# Patient Record
Sex: Female | Born: 1975 | Race: White | Marital: Married | Smoking: Never smoker
Health system: Southern US, Community
[De-identification: ages and names within clinical notes are randomized; demographics above are authoritative.]

## PROBLEM LIST (undated history)

## (undated) DIAGNOSIS — E079 Disorder of thyroid, unspecified: Secondary | ICD-10-CM

## (undated) DIAGNOSIS — E119 Type 2 diabetes mellitus without complications: Secondary | ICD-10-CM

## (undated) HISTORY — DX: Type 2 diabetes mellitus without complications: E11.9

## (undated) HISTORY — DX: Disorder of thyroid, unspecified: E07.9

## (undated) HISTORY — PX: BRAIN SURGERY: SHX531

---

## 2018-09-09 ENCOUNTER — Ambulatory Visit: Payer: Self-pay | Admitting: Urology

## 2018-10-03 ENCOUNTER — Ambulatory Visit: Payer: Self-pay | Admitting: Urology

## 2019-08-20 ENCOUNTER — Encounter: Payer: Self-pay | Admitting: Emergency Medicine

## 2019-08-20 ENCOUNTER — Emergency Department: Payer: BC Managed Care – PPO

## 2019-08-20 ENCOUNTER — Emergency Department
Admission: EM | Admit: 2019-08-20 | Discharge: 2019-08-20 | Disposition: A | Discharge status: Home / Self Care | Payer: BC Managed Care – PPO | Attending: Emergency Medicine | Admitting: Emergency Medicine

## 2019-08-20 DIAGNOSIS — M545 Low back pain: Secondary | ICD-10-CM | POA: Insufficient documentation

## 2019-08-20 DIAGNOSIS — Z5321 Procedure and treatment not carried out due to patient leaving prior to being seen by health care provider: Secondary | ICD-10-CM | POA: Insufficient documentation

## 2019-08-20 DIAGNOSIS — R300 Dysuria: Secondary | ICD-10-CM | POA: Diagnosis not present

## 2019-08-20 DIAGNOSIS — R319 Hematuria, unspecified: Secondary | ICD-10-CM | POA: Diagnosis not present

## 2019-08-20 DIAGNOSIS — R1031 Right lower quadrant pain: Secondary | ICD-10-CM | POA: Insufficient documentation

## 2019-08-20 DIAGNOSIS — R1032 Left lower quadrant pain: Secondary | ICD-10-CM | POA: Diagnosis present

## 2019-08-20 LAB — URINALYSIS, COMPLETE (UACMP) WITH MICROSCOPIC
Bilirubin Urine: NEGATIVE
Glucose, UA: NEGATIVE mg/dL
Ketones, ur: NEGATIVE mg/dL
Leukocytes,Ua: NEGATIVE
Nitrite: NEGATIVE
Protein, ur: 30 mg/dL — AB
RBC / HPF: >50 RBC/hpf — ABNORMAL HIGH (ref 0–5)
Specific Gravity, Urine: 1.025 (ref 1.005–1.030)
pH: 5 (ref 5.0–8.0)

## 2019-08-20 LAB — COMPREHENSIVE METABOLIC PANEL
ALT: 21 U/L (ref 0–44)
AST: 19 U/L (ref 15–41)
Albumin: 4.4 g/dL (ref 3.5–5.0)
Alkaline Phosphatase: 44 U/L (ref 38–126)
Anion gap: 10 (ref 5–15)
BUN: 23 mg/dL — ABNORMAL HIGH (ref 6–20)
CO2: 22 mmol/L (ref 22–32)
Calcium: 9.1 mg/dL (ref 8.9–10.3)
Chloride: 106 mmol/L (ref 98–111)
Creatinine, Ser: 0.86 mg/dL (ref 0.44–1.00)
GFR calc Af Amer: >60 mL/min (ref >60)
GFR calc non Af Amer: >60 mL/min (ref >60)
Glucose, Bld: 92 mg/dL (ref 70–99)
Potassium: 4 mmol/L (ref 3.5–5.1)
Sodium: 138 mmol/L (ref 135–145)
Total Bilirubin: 0.7 mg/dL (ref 0.3–1.2)
Total Protein: 7.7 g/dL (ref 6.5–8.1)

## 2019-08-20 LAB — CBC
HCT: 42.6 % (ref 36.0–46.0)
Hemoglobin: 13.8 g/dL (ref 12.0–15.0)
MCH: 28 pg (ref 26.0–34.0)
MCHC: 32.4 g/dL (ref 30.0–36.0)
MCV: 86.4 fL (ref 80.0–100.0)
Platelets: 267 10*3/uL (ref 150–400)
RBC: 4.93 MIL/uL (ref 3.87–5.11)
RDW: 13.2 % (ref 11.5–15.5)
WBC: 7.6 10*3/uL (ref 4.0–10.5)
nRBC: 0 % (ref 0.0–0.2)

## 2019-08-20 LAB — LIPASE, BLOOD: Lipase: 24 U/L (ref 11–51)

## 2019-08-20 LAB — POCT PREGNANCY, URINE: Preg Test, Ur: NEGATIVE

## 2019-08-20 NOTE — ED Notes (Signed)
No answer when called several times from lobby 

## 2019-08-20 NOTE — ED Triage Notes (Signed)
Pt c/o right lower abdominal pain that radiates into lower right side of back. Pt also having hematuria and pain with urination. Pt has hx/o kidney stones.

## 2019-08-21 ENCOUNTER — Encounter: Payer: Self-pay | Admitting: Urology

## 2019-08-21 ENCOUNTER — Other Ambulatory Visit: Payer: Self-pay | Admitting: Family Medicine

## 2019-08-21 ENCOUNTER — Ambulatory Visit: Admit: 2019-08-21 | Payer: BC Managed Care – PPO | Admitting: Urology

## 2019-08-21 ENCOUNTER — Other Ambulatory Visit: Payer: Self-pay

## 2019-08-21 ENCOUNTER — Ambulatory Visit (INDEPENDENT_AMBULATORY_CARE_PROVIDER_SITE_OTHER): Payer: BC Managed Care – PPO | Admitting: Urology

## 2019-08-21 ENCOUNTER — Ambulatory Visit
Admission: RE | Admit: 2019-08-21 | Discharge: 2019-08-21 | Disposition: A | Discharge status: Home / Self Care | Payer: BC Managed Care – PPO | Source: Ambulatory Visit | Attending: Family Medicine | Admitting: Family Medicine

## 2019-08-21 VITALS — BP 130/80 | HR 74 | Ht 65.0 in | Wt 247.0 lb

## 2019-08-21 DIAGNOSIS — R319 Hematuria, unspecified: Secondary | ICD-10-CM

## 2019-08-21 DIAGNOSIS — N200 Calculus of kidney: Secondary | ICD-10-CM | POA: Insufficient documentation

## 2019-08-21 DIAGNOSIS — R109 Unspecified abdominal pain: Secondary | ICD-10-CM | POA: Insufficient documentation

## 2019-08-21 DIAGNOSIS — N23 Unspecified renal colic: Secondary | ICD-10-CM | POA: Diagnosis present

## 2019-08-21 DIAGNOSIS — N201 Calculus of ureter: Secondary | ICD-10-CM

## 2019-08-21 SURGERY — LITHOTRIPSY, ESWL
Anesthesia: Moderate Sedation | Laterality: Right

## 2019-08-21 MED ORDER — TAMSULOSIN HCL 0.4 MG PO CAPS: 0.4000 mg | ORAL_CAPSULE | Freq: Every day | ORAL | 0 refills | Status: AC

## 2019-08-21 NOTE — Progress Notes (Signed)
08/21/19 2:28 PM   Desyre Filkins 10/08/1976 [id number]  Referring provider: Martin Majestic, [place],  [place] [address]  CC: Right renal colic  HPI: I saw Ms. Tani in urology clinic in consultation from Gordy Clement, NP for right renal colic.  She is a 43 year old female with past medical history notable for pulmonary emboli in 2009 from unknown etiology, currently on Xarelto, type 2 diabetes, and history of trigeminal neuralgia.  She presented briefly to the emergency room last night with right-sided flank pain and lab work was notable for for urinalysis with greater than 50 RBCs, 6-10 WBCs, few bacteria, nitrite negative, no leukocytes.  She left the ER before undergoing any imaging.  She underwent a stat CT stone protocol with her PCP today that showed a 5 mm right proximal ureteral stone with some upstream hydronephrosis.  There is a left renal nonobstructing upper pole stone.  She reports she has had a few months of intermittent right-sided pain, however her first episode of true renal colic was last night.  She has a history of 3 spontaneously passed stones previously when she would deny well, and has never required surgery.  She has come off her Xarelto for procedures in the past.  She denies any fevers, chills, dysuria.  Her pain is currently well controlled at this time.  There are no aggravating or alleviating factors.   PMH: Diabetes History of pulmonary embolus Trigeminal neuralgia   Surgical History: No past surgical history on file.   Allergies: No Known Allergies  Family History: No family history on file.  Social History:  reports that she has never smoked. She has never used smokeless tobacco. No history on file for alcohol and drug.  ROS: Please see flowsheet from today's date for complete review of systems.  Physical Exam: Ht 5\' 5"  (1.651 m)   Wt 247 lb (112 kg)   BMI 41.10 kg/m    Constitutional:  Alert and oriented, No acute  distress. Cardiovascular: Regular rate and rhythm Respiratory: Clear to auscultation bilaterally GI: Abdomen is soft, nontender, nondistended, no abdominal masses GU: Right CVA tenderness Lymph: No cervical or inguinal lymphadenopathy. Skin: No rashes, bruises or suspicious lesions. Neurologic: Grossly intact, no focal deficits, moving all 4 extremities. Psychiatric: Normal mood and affect.  Laboratory Data: Reviewed Few bacteria, nitrite negative, 6-10 WBCs, greater than 50 RBCs  Pertinent Imaging: I have personally reviewed the CT stone protocol from today, 5 mm right proximal ureteral stone, clearly seen on scout CT, 600HU, 13cm SSD  Assessment & Plan:   In summary, the patient is a 43 year old co-morbid female with history of PE on Xarelto with a non-infected 5 mm right proximal ureteral stone.  Her pain is currently very well controlled.  We discussed various treatment options for urolithiasis including observation with or without medical expulsive therapy, shockwave lithotripsy (SWL), ureteroscopy and laser lithotripsy with stent placement, and percutaneous nephrolithotomy.  We discussed that management is based on stone size, location, density, patient co-morbidities, and patient preference.   Stones <42mm in size have a >80% spontaneous passage rate. Data surrounding the use of tamsulosin for medical expulsive therapy is controversial, but meta analyses suggests it is most efficacious for distal stones between 5-74mm in size. Possible side effects include dizziness/lightheadedness, and retrograde ejaculation.  SWL has a lower stone free rate in a single procedure, but also a lower complication rate compared to ureteroscopy and avoids a stent and associated stent related symptoms. Possible complications include renal  hematoma, steinstrasse, and need for additional treatment.  Ureteroscopy with laser lithotripsy and stent placement has a higher stone free rate than SWL in a single  procedure, however increased complication rate including possible infection, ureteral injury, bleeding, and stent related morbidity. Common stent related symptoms include dysuria, urgency/frequency, and flank pain.  -She would like a trial of medical expulsive therapy which is very reasonable with her stone size and anticoagulation -Close follow-up on 09/01/2019 with KUB prior, if stone still present would hold Xarelto and pursue shockwave lithotripsy that Thursday 11/5 -We discussed return precautions including uncontrolled pain or fever over 101   Sondra Come, MD  [facility] [address], [address] [place], [place] [address] [phone]

## 2019-08-21 NOTE — Patient Instructions (Signed)
Lithotripsy  Lithotripsy is a treatment that can sometimes help eliminate kidney stones and the pain that they cause. A form of lithotripsy, also known as extracorporeal shock wave lithotripsy, is a nonsurgical procedure that crushes a kidney stone with shock waves. These shock waves pass through your body and focus on the kidney stone. They cause the kidney stone to break up while it is still in the urinary tract. This makes it easier for the smaller pieces of stone to pass in the urine. Tell a health care provider about:  Any allergies you have.  All medicines you are taking, including vitamins, herbs, eye drops, creams, and over-the-counter medicines.  Any blood disorders you have.  Any surgeries you have had.  Any medical conditions you have.  Whether you are pregnant or may be pregnant.  Any problems you or family members have had with anesthetic medicines. What are the risks? Generally, this is a safe procedure. However, problems may occur, including:  Infection.  Bleeding of the kidney.  Bruising of the kidney or skin.  Scarring of the kidney, which can lead to: ? Increased blood pressure. ? Poor kidney function. ? Return (recurrence) of kidney stones.  Damage to other structures or organs, such as the liver, colon, spleen, or pancreas.  Blockage (obstruction) of the the tube that carries urine from the kidney to the bladder (ureter).  Failure of the kidney stone to break into pieces (fragments). What happens before the procedure? Staying hydrated Follow instructions from your health care provider about hydration, which may include:  Up to 2 hours before the procedure - you may continue to drink clear liquids, such as water, clear fruit juice, black coffee, and plain tea. Eating and drinking restrictions Follow instructions from your health care provider about eating and drinking, which may include:  8 hours before the procedure - stop eating heavy meals or foods  such as meat, fried foods, or fatty foods.  6 hours before the procedure - stop eating light meals or foods, such as toast or cereal.  6 hours before the procedure - stop drinking milk or drinks that contain milk.  2 hours before the procedure - stop drinking clear liquids. General instructions  Plan to have someone take you home from the hospital or clinic.  Ask your health care provider about: ? Changing or stopping your regular medicines. This is especially important if you are taking diabetes medicines or blood thinners. ? Taking medicines such as aspirin and ibuprofen. These medicines and other NSAIDs can thin your blood. Do not take these medicines for 7 days before your procedure if your health care provider instructs you not to.  You may have tests, such as: ? Blood tests. ? Urine tests. ? Imaging tests, such as a CT scan. What happens during the procedure?  To lower your risk of infection: ? Your health care team will wash or sanitize their hands. ? Your skin will be washed with soap.  An IV tube will be inserted into one of your veins. This tube will give you fluids and medicines.  You will be given one or more of the following: ? A medicine to help you relax (sedative). ? A medicine to make you fall asleep (general anesthetic).  A water-filled cushion may be placed behind your kidney or on your abdomen. In some cases you may be placed in a tub of lukewarm water.  Your body will be positioned in a way that makes it easy to target the kidney   stone.  A flexible tube with holes in it (stent) may be placed in the ureter. This will help keep urine flowing from the kidney if the fragments of the stone have been blocking the ureter.  An X-ray or ultrasound exam will be done to locate your stone.  Shock waves will be aimed at the stone. If you are awake, you may feel a tapping sensation as the shock waves pass through your body. The procedure may vary among health care  providers and hospitals. What happens after the procedure?  You may have an X-ray to see whether the procedure was able to break up the kidney stone and how much of the stone has passed. If large stone fragments remain after treatment, you may need to have a second procedure at a later time.  Your blood pressure, heart rate, breathing rate, and blood oxygen level will be monitored until the medicines you were given have worn off.  You may be given antibiotics or pain medicine as needed.  If a stent was placed in your ureter during surgery, it may stay in place for a few weeks.  You may need strain your urine to collect pieces of the kidney stone for testing.  You will need to drink plenty of water.  Do not drive for 24 hours if you were given a sedative. Summary  Lithotripsy is a treatment that can sometimes help eliminate kidney stones and the pain that they cause.  A form of lithotripsy, also known as extracorporeal shock wave lithotripsy, is a nonsurgical procedure that crushes a kidney stone with shock waves.  Generally, this is a safe procedure. However, problems may occur, including damage to the kidney or other organs, infection, or obstruction of the tube that carries urine from the kidney to the bladder (ureter).  When you go home, you will need to drink plenty of water. You may be asked to strain your urine to collect pieces of the kidney stone for testing. This information is not intended to replace advice given to you by your health care provider. Make sure you discuss any questions you have with your health care provider. Document Released: 10/13/2000 Document Revised: 01/27/2019 Document Reviewed: 09/06/2016 Elsevier Patient Education  2020 Elsevier Inc.  

## 2019-09-01 ENCOUNTER — Ambulatory Visit
Admission: RE | Admit: 2019-09-01 | Discharge: 2019-09-01 | Disposition: A | Discharge status: Home / Self Care | Payer: BC Managed Care – PPO | Source: Ambulatory Visit | Attending: Urology | Admitting: Urology

## 2019-09-01 ENCOUNTER — Other Ambulatory Visit: Payer: Self-pay | Admitting: Urology

## 2019-09-01 ENCOUNTER — Ambulatory Visit (INDEPENDENT_AMBULATORY_CARE_PROVIDER_SITE_OTHER): Payer: BC Managed Care – PPO | Admitting: Urology

## 2019-09-01 ENCOUNTER — Encounter: Payer: Self-pay | Admitting: Urology

## 2019-09-01 ENCOUNTER — Other Ambulatory Visit: Payer: Self-pay

## 2019-09-01 VITALS — BP 123/81 | HR 70 | Ht 65.0 in | Wt 254.0 lb

## 2019-09-01 DIAGNOSIS — N201 Calculus of ureter: Secondary | ICD-10-CM

## 2019-09-01 DIAGNOSIS — N200 Calculus of kidney: Secondary | ICD-10-CM

## 2019-09-01 NOTE — Patient Instructions (Signed)
Lithotripsy  Lithotripsy is a treatment that can sometimes help eliminate kidney stones and the pain that they cause. A form of lithotripsy, also known as extracorporeal shock wave lithotripsy, is a nonsurgical procedure that crushes a kidney stone with shock waves. These shock waves pass through your body and focus on the kidney stone. They cause the kidney stone to break up while it is still in the urinary tract. This makes it easier for the smaller pieces of stone to pass in the urine. Tell a health care provider about:  Any allergies you have.  All medicines you are taking, including vitamins, herbs, eye drops, creams, and over-the-counter medicines.  Any blood disorders you have.  Any surgeries you have had.  Any medical conditions you have.  Whether you are pregnant or may be pregnant.  Any problems you or family members have had with anesthetic medicines. What are the risks? Generally, this is a safe procedure. However, problems may occur, including:  Infection.  Bleeding of the kidney.  Bruising of the kidney or skin.  Scarring of the kidney, which can lead to: ? Increased blood pressure. ? Poor kidney function. ? Return (recurrence) of kidney stones.  Damage to other structures or organs, such as the liver, colon, spleen, or pancreas.  Blockage (obstruction) of the the tube that carries urine from the kidney to the bladder (ureter).  Failure of the kidney stone to break into pieces (fragments). What happens before the procedure? Staying hydrated Follow instructions from your health care provider about hydration, which may include:  Up to 2 hours before the procedure - you may continue to drink clear liquids, such as water, clear fruit juice, black coffee, and plain tea. Eating and drinking restrictions Follow instructions from your health care provider about eating and drinking, which may include:  8 hours before the procedure - stop eating heavy meals or foods  such as meat, fried foods, or fatty foods.  6 hours before the procedure - stop eating light meals or foods, such as toast or cereal.  6 hours before the procedure - stop drinking milk or drinks that contain milk.  2 hours before the procedure - stop drinking clear liquids. General instructions  Plan to have someone take you home from the hospital or clinic.  Ask your health care provider about: ? Changing or stopping your regular medicines. This is especially important if you are taking diabetes medicines or blood thinners. ? Taking medicines such as aspirin and ibuprofen. These medicines and other NSAIDs can thin your blood. Do not take these medicines for 7 days before your procedure if your health care provider instructs you not to.  You may have tests, such as: ? Blood tests. ? Urine tests. ? Imaging tests, such as a CT scan. What happens during the procedure?  To lower your risk of infection: ? Your health care team will wash or sanitize their hands. ? Your skin will be washed with soap.  An IV tube will be inserted into one of your veins. This tube will give you fluids and medicines.  You will be given one or more of the following: ? A medicine to help you relax (sedative). ? A medicine to make you fall asleep (general anesthetic).  A water-filled cushion may be placed behind your kidney or on your abdomen. In some cases you may be placed in a tub of lukewarm water.  Your body will be positioned in a way that makes it easy to target the kidney   stone.  A flexible tube with holes in it (stent) may be placed in the ureter. This will help keep urine flowing from the kidney if the fragments of the stone have been blocking the ureter.  An X-ray or ultrasound exam will be done to locate your stone.  Shock waves will be aimed at the stone. If you are awake, you may feel a tapping sensation as the shock waves pass through your body. The procedure may vary among health care  providers and hospitals. What happens after the procedure?  You may have an X-ray to see whether the procedure was able to break up the kidney stone and how much of the stone has passed. If large stone fragments remain after treatment, you may need to have a second procedure at a later time.  Your blood pressure, heart rate, breathing rate, and blood oxygen level will be monitored until the medicines you were given have worn off.  You may be given antibiotics or pain medicine as needed.  If a stent was placed in your ureter during surgery, it may stay in place for a few weeks.  You may need strain your urine to collect pieces of the kidney stone for testing.  You will need to drink plenty of water.  Do not drive for 24 hours if you were given a sedative. Summary  Lithotripsy is a treatment that can sometimes help eliminate kidney stones and the pain that they cause.  A form of lithotripsy, also known as extracorporeal shock wave lithotripsy, is a nonsurgical procedure that crushes a kidney stone with shock waves.  Generally, this is a safe procedure. However, problems may occur, including damage to the kidney or other organs, infection, or obstruction of the tube that carries urine from the kidney to the bladder (ureter).  When you go home, you will need to drink plenty of water. You may be asked to strain your urine to collect pieces of the kidney stone for testing. This information is not intended to replace advice given to you by your health care provider. Make sure you discuss any questions you have with your health care provider. Document Released: 10/13/2000 Document Revised: 01/27/2019 Document Reviewed: 09/06/2016 Elsevier Patient Education  2020 Elsevier Inc.  

## 2019-09-01 NOTE — Progress Notes (Signed)
   09/01/2019 3:30 PM   Megan Christensen April 14, 1976 [id number]  Reason for visit: Follow up 5 mm right ureteral stone  HPI: I saw [name] for follow-up of a right ureteral stone.  She is a 43 year old female with history of nephrolithiasis but no prior stone surgeries who has had greater than 4-week history of intermittent right-sided flank pain.  CT on 10/22 showed a 5 mm right proximal ureteral stone with upstream hydronephrosis.  She elected for a trial of medical expulsive therapy over the last 10 days with Flomax.  She continues to have intermittent right-sided pain and hematuria.  KUB today shows right mid ureteral stone clearly seen on KUB.  We discussed various treatment options for urolithiasis including observation with or without medical expulsive therapy, shockwave lithotripsy (SWL), ureteroscopy and laser lithotripsy with stent placement, and percutaneous nephrolithotomy.  We discussed that management is based on stone size, location, density, patient co-morbidities, and patient preference.   Stones <44mm in size have a >80% spontaneous passage rate. Data surrounding the use of tamsulosin for medical expulsive therapy is controversial, but meta analyses suggests it is most efficacious for distal stones between 5-48mm in size. Possible side effects include dizziness/lightheadedness, and retrograde ejaculation.  SWL has a lower stone free rate in a single procedure, but also a lower complication rate compared to ureteroscopy and avoids a stent and associated stent related symptoms. Possible complications include renal hematoma, steinstrasse, and need for additional treatment.  Ureteroscopy with laser lithotripsy and stent placement has a higher stone free rate than SWL in a single procedure, however increased complication rate including possible infection, ureteral injury, bleeding, and stent related morbidity. Common stent related symptoms include dysuria, urgency/frequency, and flank pain.   After an extensive discussion of the risks and benefits of the above treatment options, the patient would like to proceed with right shockwave lithotripsy.  Urinalysis today pending  Schedule right shockwave lithotripsy this Thursday 11/5  A total of 15 minutes were spent face-to-face with the patient, greater than 50% was spent in patient education, counseling, and coordination of care regarding nephrolithiasis and stone surgery options.  Billey Co, [facility] [address], [place], [place] [address] [phone]

## 2019-09-02 ENCOUNTER — Other Ambulatory Visit
Admission: RE | Admit: 2019-09-02 | Discharge: 2019-09-02 | Disposition: A | Discharge status: Home / Self Care | Payer: BC Managed Care – PPO | Source: Ambulatory Visit | Attending: Urology | Admitting: Urology

## 2019-09-02 ENCOUNTER — Other Ambulatory Visit: Payer: Self-pay | Admitting: Radiology

## 2019-09-02 DIAGNOSIS — Z20828 Contact with and (suspected) exposure to other viral communicable diseases: Secondary | ICD-10-CM | POA: Diagnosis not present

## 2019-09-02 DIAGNOSIS — Z01812 Encounter for preprocedural laboratory examination: Secondary | ICD-10-CM | POA: Diagnosis not present

## 2019-09-02 DIAGNOSIS — N201 Calculus of ureter: Secondary | ICD-10-CM

## 2019-09-02 LAB — MICROSCOPIC EXAMINATION: RBC, Urine: >30 /hpf — AB (ref 0–2)

## 2019-09-02 LAB — URINALYSIS, COMPLETE
Bilirubin, UA: NEGATIVE
Glucose, UA: NEGATIVE
Ketones, UA: NEGATIVE
Leukocytes,UA: NEGATIVE
Nitrite, UA: NEGATIVE
Protein,UA: NEGATIVE
Specific Gravity, UA: 1.025 (ref 1.005–1.030)
Urobilinogen, Ur: 0.2 mg/dL (ref 0.2–1.0)
pH, UA: 5 (ref 5.0–7.5)

## 2019-09-02 LAB — SARS CORONAVIRUS 2 (TAT 6-24 HRS): SARS Coronavirus 2: NEGATIVE

## 2019-09-04 ENCOUNTER — Other Ambulatory Visit: Payer: Self-pay

## 2019-09-04 ENCOUNTER — Ambulatory Visit
Admission: RE | Admit: 2019-09-04 | Discharge: 2019-09-04 | Disposition: A | Discharge status: Home / Self Care | Payer: BC Managed Care – PPO | Source: Ambulatory Visit | Attending: Urology | Admitting: Urology

## 2019-09-04 ENCOUNTER — Encounter: Payer: Self-pay | Admitting: *Deleted

## 2019-09-04 ENCOUNTER — Encounter
Admission: RE | Disposition: A | Discharge status: Home / Self Care | Payer: Self-pay | Source: Ambulatory Visit | Attending: Urology

## 2019-09-04 ENCOUNTER — Ambulatory Visit: Payer: BC Managed Care – PPO

## 2019-09-04 DIAGNOSIS — N201 Calculus of ureter: Secondary | ICD-10-CM

## 2019-09-04 DIAGNOSIS — Z86718 Personal history of other venous thrombosis and embolism: Secondary | ICD-10-CM | POA: Insufficient documentation

## 2019-09-04 DIAGNOSIS — E669 Obesity, unspecified: Secondary | ICD-10-CM | POA: Insufficient documentation

## 2019-09-04 DIAGNOSIS — E119 Type 2 diabetes mellitus without complications: Secondary | ICD-10-CM | POA: Insufficient documentation

## 2019-09-04 HISTORY — PX: EXTRACORPOREAL SHOCK WAVE LITHOTRIPSY: SHX1557

## 2019-09-04 LAB — GLUCOSE, CAPILLARY
Glucose-Capillary: 77 mg/dL (ref 70–99)
Glucose-Capillary: 106 mg/dL — ABNORMAL HIGH (ref 70–99)

## 2019-09-04 LAB — CULTURE, URINE COMPREHENSIVE

## 2019-09-04 SURGERY — LITHOTRIPSY, ESWL
Anesthesia: Moderate Sedation | Laterality: Right

## 2019-09-04 MED ORDER — DIAZEPAM 5 MG PO TABS
ORAL_TABLET | ORAL | Status: AC
  Administered 2019-09-04: 10 mg via ORAL
  Filled 2019-09-04: qty 2

## 2019-09-04 MED ORDER — DIPHENHYDRAMINE HCL 25 MG PO CAPS
25.0000 mg | ORAL_CAPSULE | ORAL | Status: AC
  Administered 2019-09-04: 09:00:00 25 mg via ORAL

## 2019-09-04 MED ORDER — DEXTROSE-NACL 5-0.45 % IV SOLN
INTRAVENOUS | Status: DC
  Administered 2019-09-04: 10:00:00 via INTRAVENOUS

## 2019-09-04 MED ORDER — ONDANSETRON HCL 4 MG/2ML IJ SOLN
4.0000 mg | Freq: Once | INTRAMUSCULAR | Status: AC | PRN
  Administered 2019-09-04: 09:00:00 4 mg via INTRAVENOUS

## 2019-09-04 MED ORDER — HYDROCODONE-ACETAMINOPHEN 5-325 MG PO TABS: 1.0000 | ORAL_TABLET | ORAL | 0 refills | Status: AC | PRN

## 2019-09-04 MED ORDER — CIPROFLOXACIN HCL 500 MG PO TABS
500.0000 mg | ORAL_TABLET | ORAL | Status: AC
  Administered 2019-09-04: 09:00:00 500 mg via ORAL

## 2019-09-04 MED ORDER — DIAZEPAM 5 MG PO TABS
10.0000 mg | ORAL_TABLET | ORAL | Status: AC
  Administered 2019-09-04: 09:00:00 10 mg via ORAL

## 2019-09-04 MED ORDER — ONDANSETRON HCL 4 MG/2ML IJ SOLN
INTRAMUSCULAR | Status: AC
  Administered 2019-09-04: 4 mg via INTRAVENOUS
  Filled 2019-09-04: qty 2

## 2019-09-04 MED ORDER — DIPHENHYDRAMINE HCL 25 MG PO CAPS
ORAL_CAPSULE | ORAL | Status: AC
  Administered 2019-09-04: 25 mg via ORAL
  Filled 2019-09-04: qty 1

## 2019-09-04 MED ORDER — SODIUM CHLORIDE 0.9 % IV SOLN
INTRAVENOUS | Status: DC
  Administered 2019-09-04: 09:00:00 via INTRAVENOUS

## 2019-09-04 MED ORDER — CIPROFLOXACIN HCL 500 MG PO TABS
ORAL_TABLET | ORAL | Status: AC
  Administered 2019-09-04: 500 mg via ORAL
  Filled 2019-09-04: qty 1

## 2019-09-04 NOTE — OR Nursing (Signed)
Patient's blood sugar is 77 this morning. She took 1,000 mg metformin at 6:30 am today as instructed on her instructions sheet from the office. Dr. Diamantina Providence notified. He has given no further orders.

## 2019-09-04 NOTE — Discharge Instructions (Signed)
Refer to [facility] discharge sheet

## 2019-09-04 NOTE — H&P (Signed)
UROLOGY H&P UPDATE  Agree with prior H&P dated 10/22. Right 28m distal ureteral stone >3 weeks, failed MET.  Cardiac: RRR Lungs: CTA bilaterally  Laterality: right Procedure: shockwave lithotripsy  Urine: 11/2: 10-25k mixed flora  Informed consent obtained, we specifically discussed the risks of bleeding, infection, post-operative pain, need for additional procedures, steinstrasse, hematoma.  BBilley Co MD 09/04/2019

## 2019-09-05 ENCOUNTER — Encounter: Payer: Self-pay | Admitting: Urology

## 2019-09-22 ENCOUNTER — Ambulatory Visit: Payer: BC Managed Care – PPO | Admitting: Urology

## 2019-09-30 ENCOUNTER — Ambulatory Visit (INDEPENDENT_AMBULATORY_CARE_PROVIDER_SITE_OTHER): Payer: BC Managed Care – PPO | Admitting: Physician Assistant

## 2019-09-30 ENCOUNTER — Ambulatory Visit
Admission: RE | Admit: 2019-09-30 | Discharge: 2019-09-30 | Disposition: A | Discharge status: Home / Self Care | Payer: BC Managed Care – PPO | Source: Ambulatory Visit | Attending: Urology | Admitting: Urology

## 2019-09-30 ENCOUNTER — Other Ambulatory Visit: Payer: Self-pay

## 2019-09-30 ENCOUNTER — Other Ambulatory Visit: Payer: Self-pay | Admitting: Urology

## 2019-09-30 ENCOUNTER — Encounter: Payer: Self-pay | Admitting: Physician Assistant

## 2019-09-30 VITALS — BP 140/96 | HR 91 | Ht 65.0 in | Wt 250.0 lb

## 2019-09-30 DIAGNOSIS — N201 Calculus of ureter: Secondary | ICD-10-CM

## 2019-09-30 DIAGNOSIS — Z87442 Personal history of urinary calculi: Secondary | ICD-10-CM | POA: Diagnosis not present

## 2019-09-30 DIAGNOSIS — N200 Calculus of kidney: Secondary | ICD-10-CM

## 2019-09-30 NOTE — Progress Notes (Signed)
09/30/2019 3:10 PM   Megan Christensen Jul 01, 1976 [id number]  CC: Postop ESWL  HPI: Megan Christensen is a 43 y.o. female who presents for postoperative evaluation s/p ESWL with Dr. Richardo Hanks on 09/04/2019.  In the interim, she reports resolution of her right flank pain.  She denies gross hematuria.  She was able to collect some stone fragments and has brought them with her today for analysis.  Patient moved from [place], [place] approximately 2 years ago.  She reports a history of recurrent nephrolithiasis with 5 total stones passed since 2009 including the current one.  She has been able to spontaneously pass all of her other stones.  She denies prior metabolic work-up for stones.  She denies a family history of nephrolithiasis, however she states she is unsure about her father's side of the family.  Follow-up KUB today with no stone fragments visualized on the left side with a seemingly stable nonobstructing 5 mm left renal stone.  PMH: Past Medical History:  Diagnosis Date  . Diabetes mellitus without complication (HCC)   . Thyroid disorder     Surgical History: Past Surgical History:  Procedure Laterality Date  . BRAIN SURGERY    . EXTRACORPOREAL SHOCK WAVE LITHOTRIPSY Right 09/04/2019   Procedure: EXTRACORPOREAL SHOCK WAVE LITHOTRIPSY (ESWL);  Surgeon: Sondra Come, MD;  Location: [facility] ORS;  Service: Urology;  Laterality: Right;    Home Medications:  Allergies as of 09/30/2019   No Known Allergies     Medication List       Accurate as of September 30, 2019  3:10 PM. If you have any questions, ask your nurse or doctor.        atorvastatin 20 MG tablet Commonly known as: LIPITOR Take 20 mg by mouth at bedtime.   hydrochlorothiazide 12.5 MG tablet Commonly known as: HYDRODIURIL Take 12.5 mg by mouth daily.   levothyroxine 125 MCG tablet Commonly known as: SYNTHROID Take 125 mcg by mouth daily.   metFORMIN 1000 MG tablet Commonly known as: GLUCOPHAGE Take 1,000 mg by  mouth 2 (two) times daily.   tamsulosin 0.4 MG Caps capsule Commonly known as: FLOMAX Take 1 capsule (0.4 mg total) by mouth daily.   Xarelto 10 MG Tabs tablet Generic drug: rivaroxaban Take 10 mg by mouth daily.       Allergies: No Known Allergies  Family History: No family history on file.  Social History:  reports that she has never smoked. She has never used smokeless tobacco. She reports current alcohol use of about 1.0 standard drinks of alcohol per week. She reports that she does not use drugs.  ROS: UROLOGY Frequent Urination?: No Hard to postpone urination?: No Burning/pain with urination?: No Get up at night to urinate?: No Leakage of urine?: No Urine stream starts and stops?: No Trouble starting stream?: No Do you have to strain to urinate?: No Blood in urine?: No Urinary tract infection?: No Sexually transmitted disease?: No Injury to kidneys or bladder?: No Painful intercourse?: No Weak stream?: No Currently pregnant?: No Vaginal bleeding?: No Last menstrual period?: n  Gastrointestinal Nausea?: No Vomiting?: No Indigestion/heartburn?: No Diarrhea?: No Constipation?: No  Constitutional Fever: No Night sweats?: No Weight loss?: No Fatigue?: No  Skin Skin rash/lesions?: No Itching?: No  Eyes Blurred vision?: No Double vision?: No  Ears/Nose/Throat Sore throat?: No Sinus problems?: No  Hematologic/Lymphatic Swollen glands?: No Easy bruising?: Yes  Cardiovascular Leg swelling?: No Chest pain?: No  Respiratory Cough?: No Shortness of breath?: No  Endocrine Excessive  thirst?: No  Musculoskeletal Back pain?: No Joint pain?: No  Neurological Headaches?: No Dizziness?: No  Psychologic Depression?: No Anxiety?: No  Physical Exam: BP (!) 140/96   Pulse 91   Ht 5\' 5"  (1.651 m)   Wt 250 lb (113.4 kg)   BMI 41.60 kg/m   Constitutional:  Alert and oriented, No acute distress. HEENT: [place] AT, moist mucus membranes.   Trachea midline, no masses. Cardiovascular: No clubbing, cyanosis, or edema. Respiratory: Normal respiratory effort, no increased work of breathing. Skin: No rashes, bruises or suspicious lesions. Neurologic: Grossly intact, no focal deficits, moving all 4 extremities. Psychiatric: Normal mood and affect.  Pertinent Imaging: KUB, 09/30/2019: CLINICAL DATA:  Nephrolithiasis  EXAM: ABDOMEN - 1 VIEW  COMPARISON:  CT 08/21/2019.  Most recent KUB dating 09/04/2019.  FINDINGS: Left upper pole 5 mm stone again noted as seen on prior CT. Previously seen right ureteral stone no longer visualized. Nonobstructive bowel gas pattern. No organomegaly or free air.  IMPRESSION: Left nephrolithiasis. No definite right ureteral stone at this time.   Electronically Signed   By: Rolm Baptise M.D.   On: 09/30/2019 22:44  I personally reviewed the images referenced above and note the absence of residual stone fragments in the mid to distal right ureter, as well as a stable approximate 5 mm stone consistent with what was previously seen in her left kidney per CT.  Assessment & Plan:   1. Right ureteral stone 43 year old female s/p ESWL for a 5 mm right mid ureteral stone.  No residual stone fragments per KUB today.  No further intervention required at this time; will send collected fragments for analysis.   2. History of nephrolithiasis Patient has a history of recurrent nephrolithiasis with 1 approximate 5 mm nonobstructing left renal stone still in place.  Given her history, I would like to see her on an annual basis with KUB's for routine monitoring of her stone burden.  I discussed the role of metabolic analysis in recurrent stone formers.  She would like to defer this pending the results of stone analysis as described above.  I am in agreement with this plan.  In the meantime, I provided her with an ABCs of Kidney Stones booklet for further education. - DG Abd 1 View; Future   Return in  about 1 year (around 09/29/2020) for Nephrolithiasis f/u with KUB prior.  Debroah Loop, PA-C  [facility] [address] [place], [place] [address] [phone]

## 2019-10-02 ENCOUNTER — Other Ambulatory Visit: Payer: Self-pay | Admitting: Physician Assistant

## 2020-09-30 ENCOUNTER — Ambulatory Visit: Payer: BC Managed Care – PPO | Admitting: Physician Assistant

## 2020-10-01 ENCOUNTER — Encounter: Payer: Self-pay | Admitting: Physician Assistant

## 2021-05-18 IMAGING — CT CT RENAL STONE PROTOCOL
2 of 4 series · 16 of 46 positions shown, 18 images · non-contrast
Comparison: None.

CLINICAL DATA: Right flank pain and hematuria

EXAM:
CT ABDOMEN AND PELVIS WITHOUT CONTRAST
TECHNIQUE: Multidetector CT imaging of the abdomen and pelvis was performed
following the standard protocol without oral or IV contrast.

[Series 2: stone full standard · axial · 0.76mm/px · z∈[-528,-43]mm · 13 of 107 slices shown, 15 images]
[im 5/107  soft-tissue]
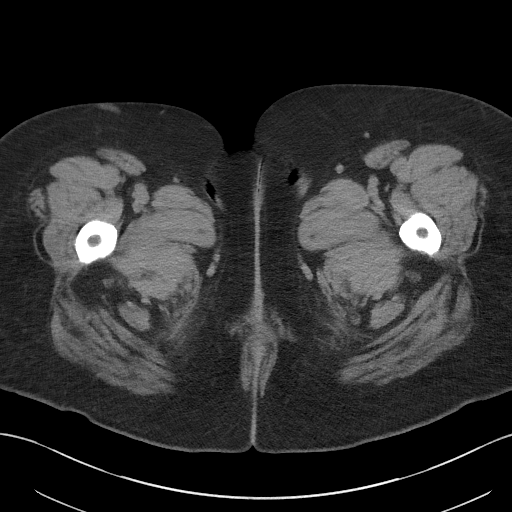
[im 5/107  bone]
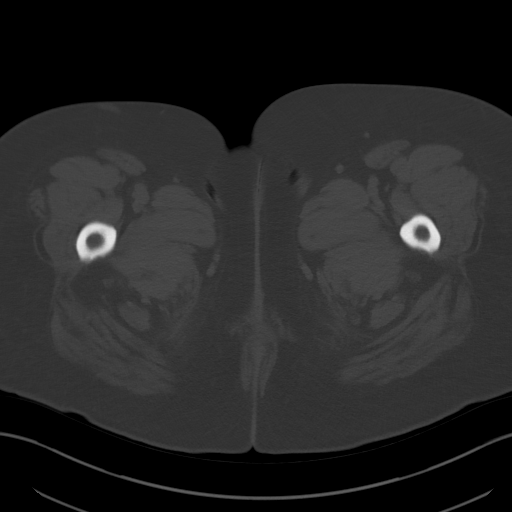
[im 15/107  soft-tissue]
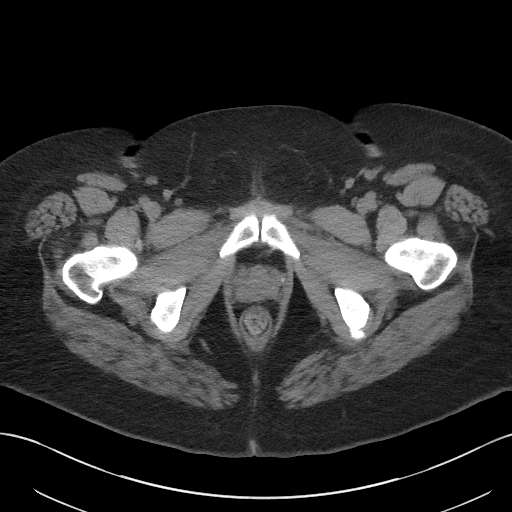
[im 25/107  soft-tissue]
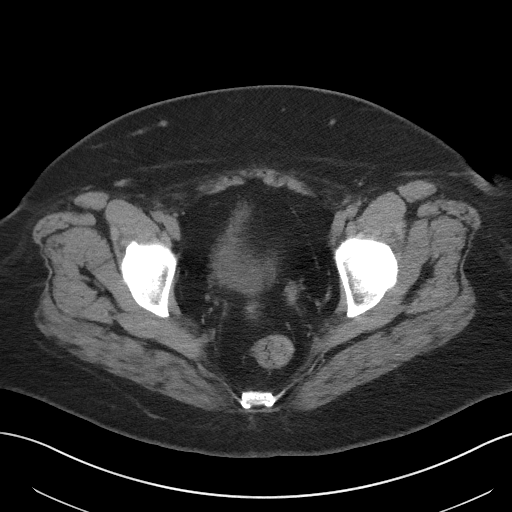
[im 29/107  soft-tissue]
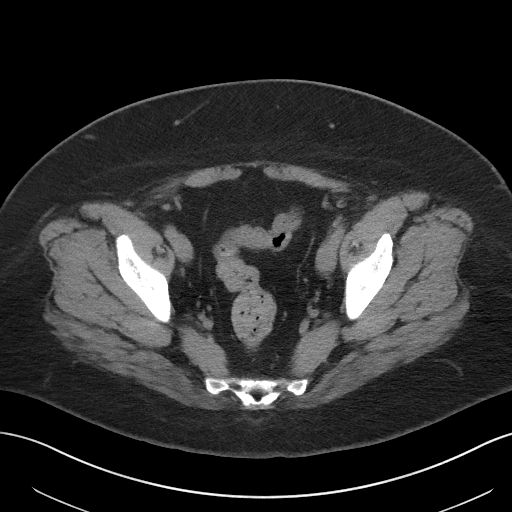
[im 39/107  soft-tissue]
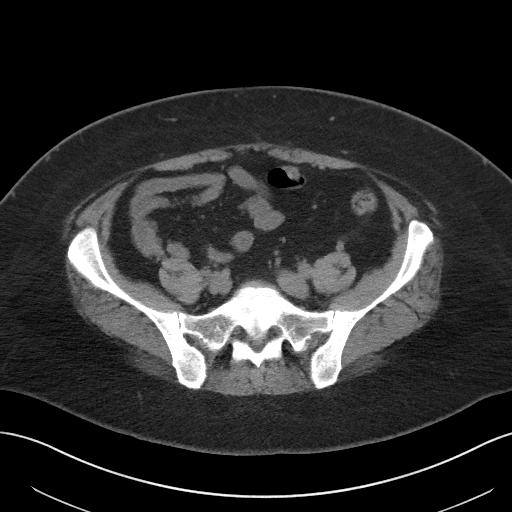
[im 44/107  soft-tissue]
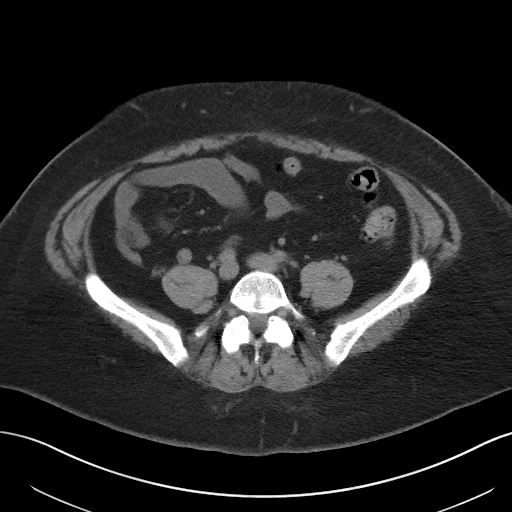
[im 54/107  soft-tissue]
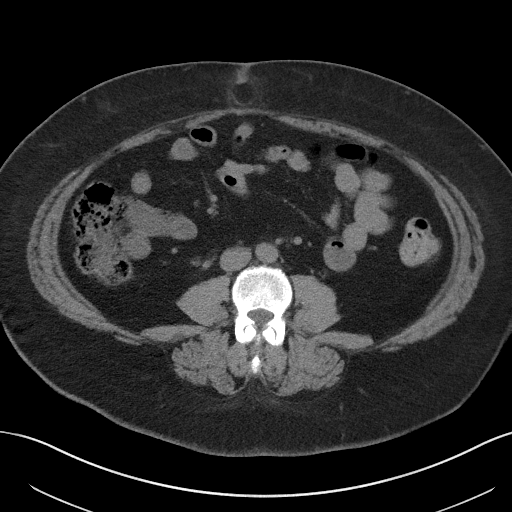
[im 63/107  soft-tissue]
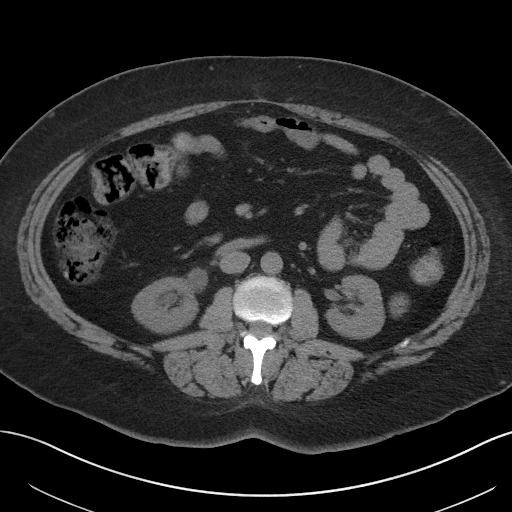
[im 68/107  soft-tissue]
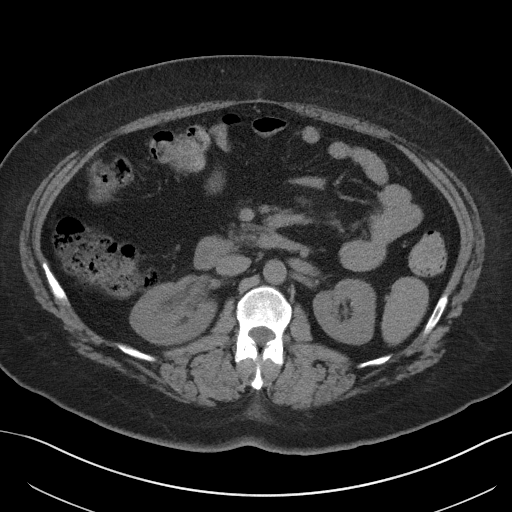
[im 68/107  bone]
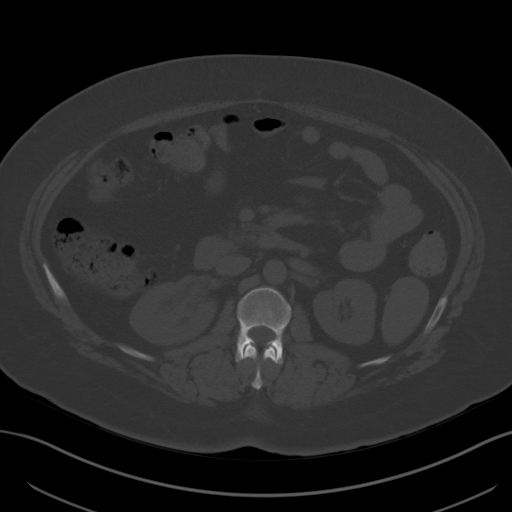
[im 78/107  soft-tissue]
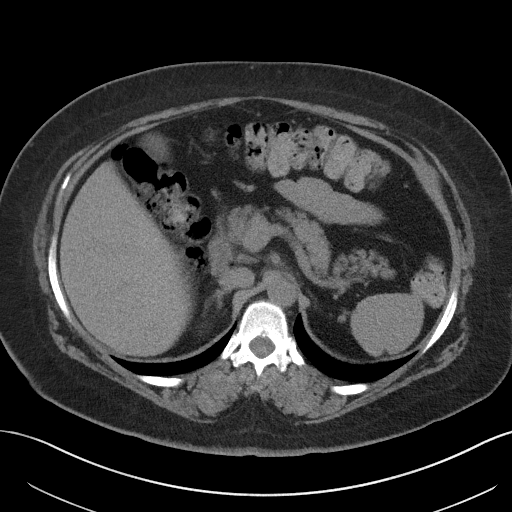
[im 82/107  soft-tissue]
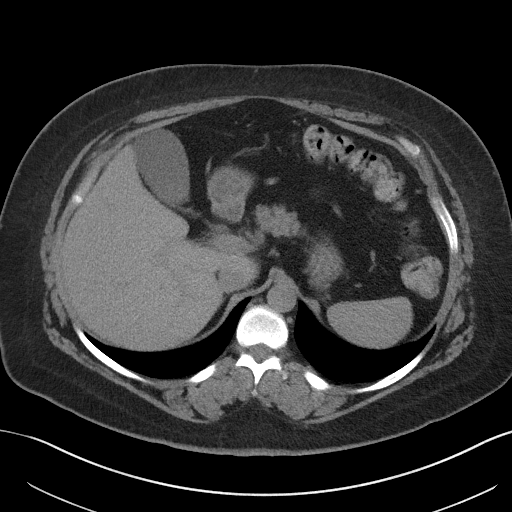
[im 92/107  soft-tissue]
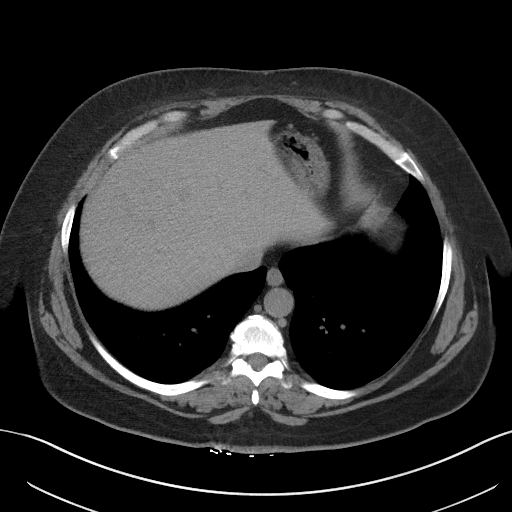
[im 102/107  soft-tissue]
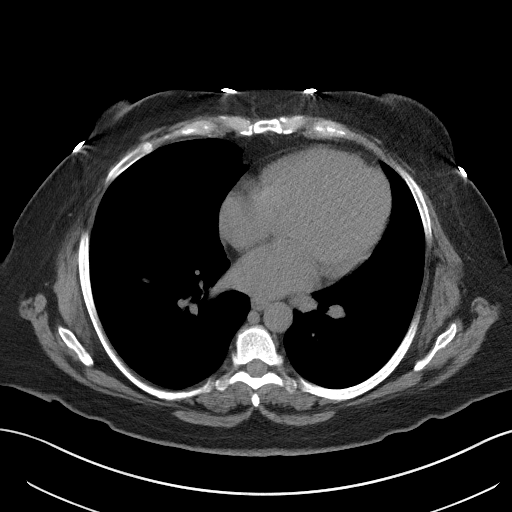

[Series 5: coronal · coronal · 0.88mm/px · 3 of 142 slices shown]
[im 48/142  soft-tissue]
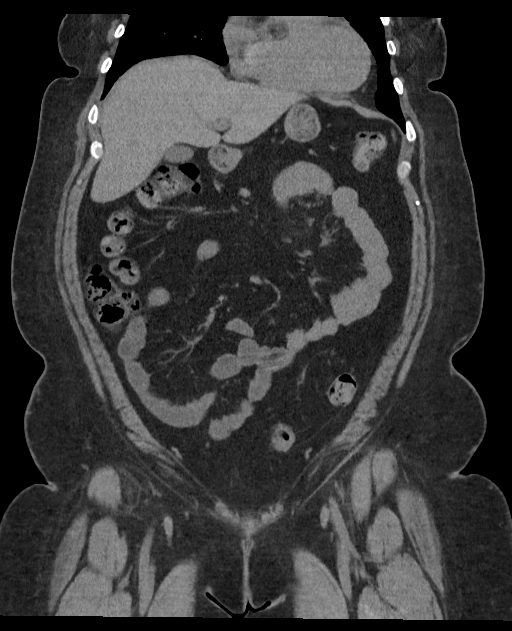
[im 63/142  soft-tissue]
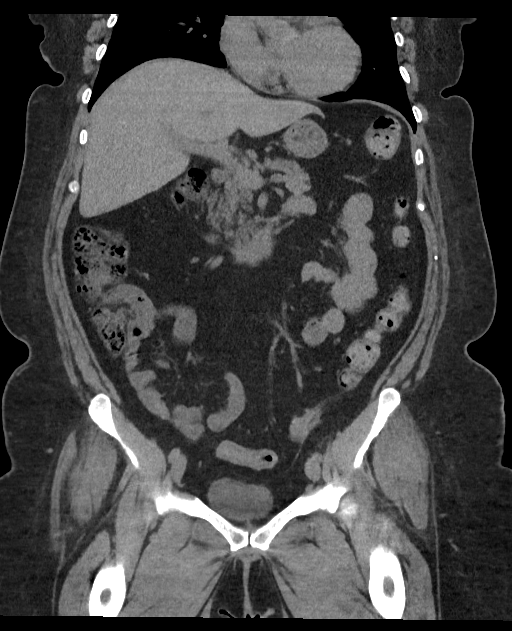
[im 79/142  soft-tissue]
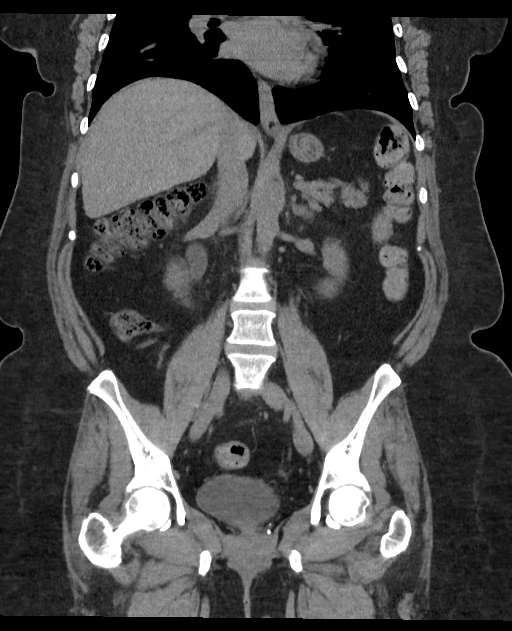

[16 of 46 positions shown; findings below may reference images not displayed]

FINDINGS: Lower chest: Lung bases are clear.

Hepatobiliary: No focal liver lesions are evident on this
noncontrast enhanced study. Gallbladder wall is not appreciably
thickened. There is no biliary duct dilatation.

Pancreas: There is no pancreatic mass or inflammatory focus.

Spleen: No splenic lesions are evident.

Adrenals/Urinary Tract: Adrenals bilaterally appear unremarkable.
There is no evident renal mass on either side. There is moderate
hydronephrosis on the right. There is no appreciable hydronephrosis
on the left. There is a calculus in the upper pole of the left
kidney measuring 0.8 x 0.7 cm. There is no intrarenal calculus on
the right. There is a calculus in the proximal right ureter at the
L3-4 level measuring 5 x 5 mm. There is localized ureteral edema in
this area. No other ureteral calculi evident on either side. Urinary
bladder is midline with wall thickness within normal limits.

Stomach/Bowel: There is no appreciable bowel wall or mesenteric
thickening. There is no evident bowel obstruction. Terminal ileum
appears unremarkable. There is no evident free air or portal venous
air.

Vascular/Lymphatic: There is no abdominal aortic aneurysm. No
vascular lesions are evident on this noncontrast enhanced study.
There is no evident adenopathy in the abdomen or pelvis.

Reproductive: Uterus is absent. There is no pelvic mass.

Other: Appendix appears normal. There is no abscess or ascites in
the abdomen or pelvis.

Musculoskeletal: No blastic or lytic bone lesions. There is no
intramuscular or abdominal wall lesion.
IMPRESSION: 1. 5 x 5 mm proximal right ureteral calculus with moderate right
hydronephrosis. Localized edema in the right ureter at the site of
calculus.
2. Nonobstructing calculus in the upper pole of the left kidney
measuring 0.8 x 0.7 cm.
3. No bowel obstruction. No abscess in the abdomen pelvis. Appendix
appears normal.

These results will be called to the ordering clinician or
representative by the Radiologist Assistant, and communication
documented in the PACS or zVision Dashboard.

## 2021-05-29 IMAGING — CR DG ABDOMEN 1V
1 series · 2 of 2 positions shown · non-contrast
Comparison: CT 08/21/2019

CLINICAL DATA: Right ureteral stone. Right flank pain 2 months.
Hematuria 1 month.

EXAM:
ABDOMEN - 1 VIEW

[Series 1: t abdomen supine · 0.14mm/px · 2 of 2 slices shown]
[im 1/2]
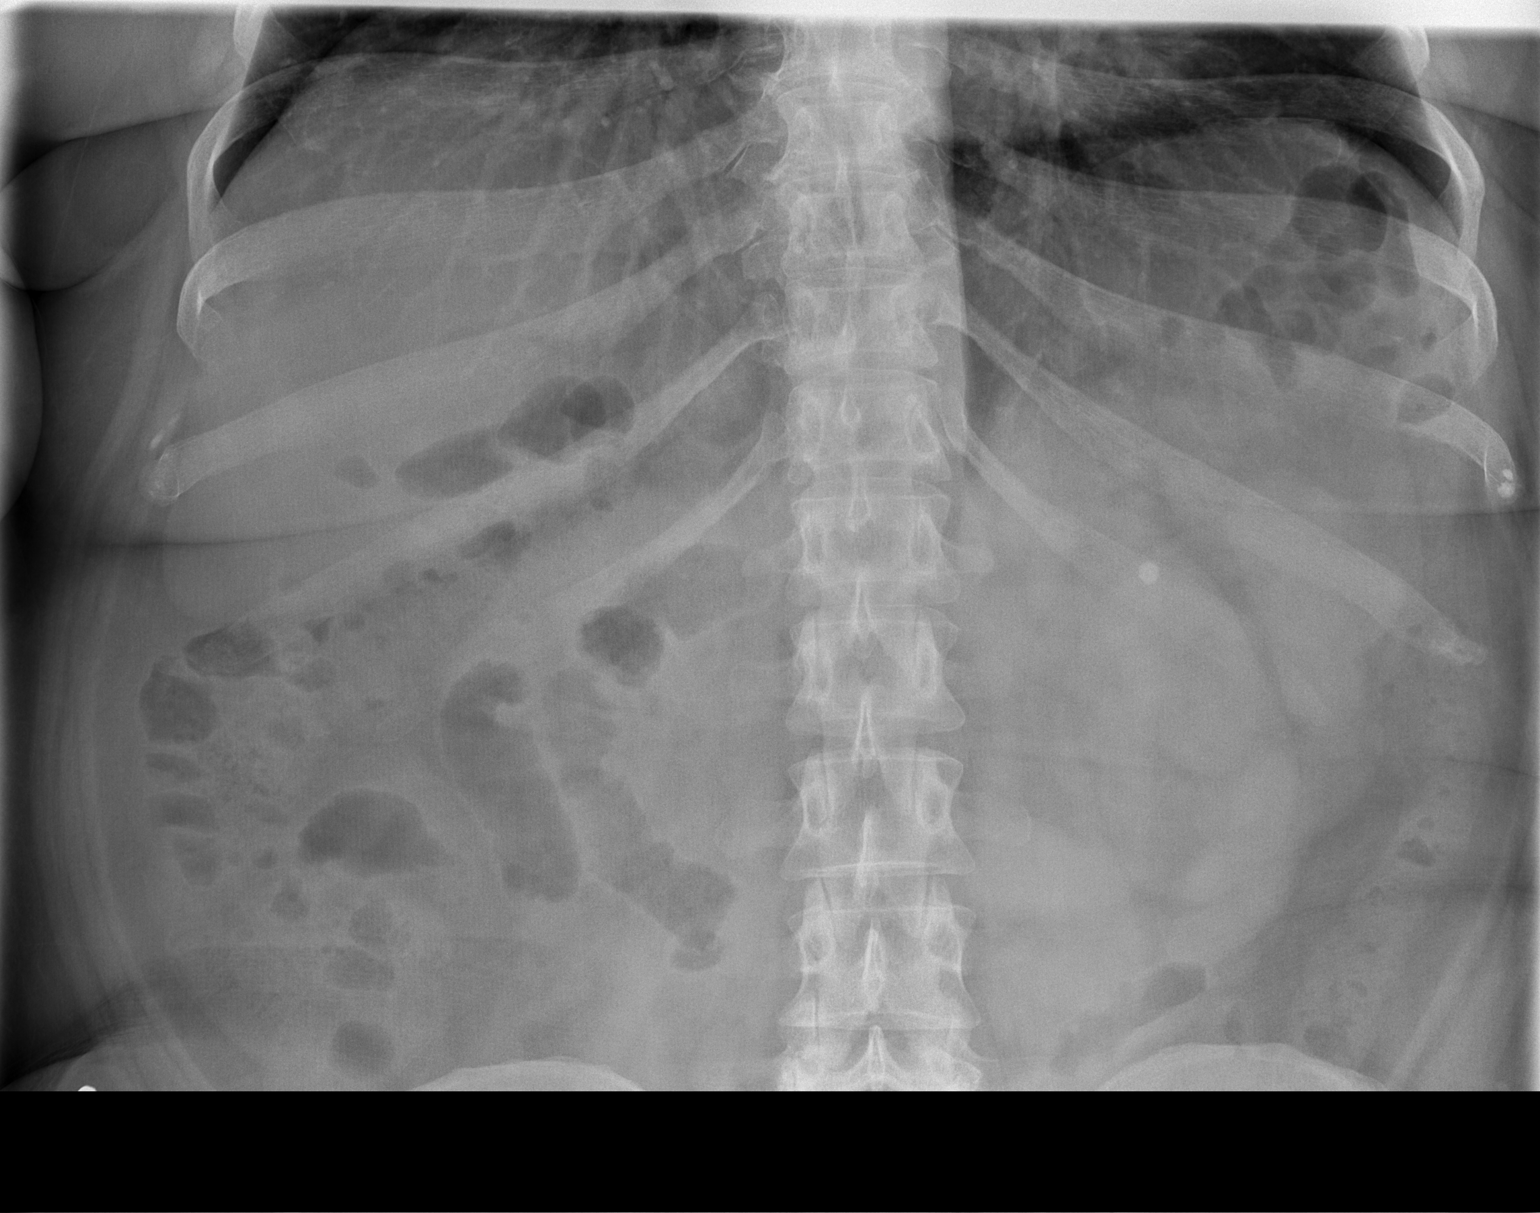
[im 2/2]
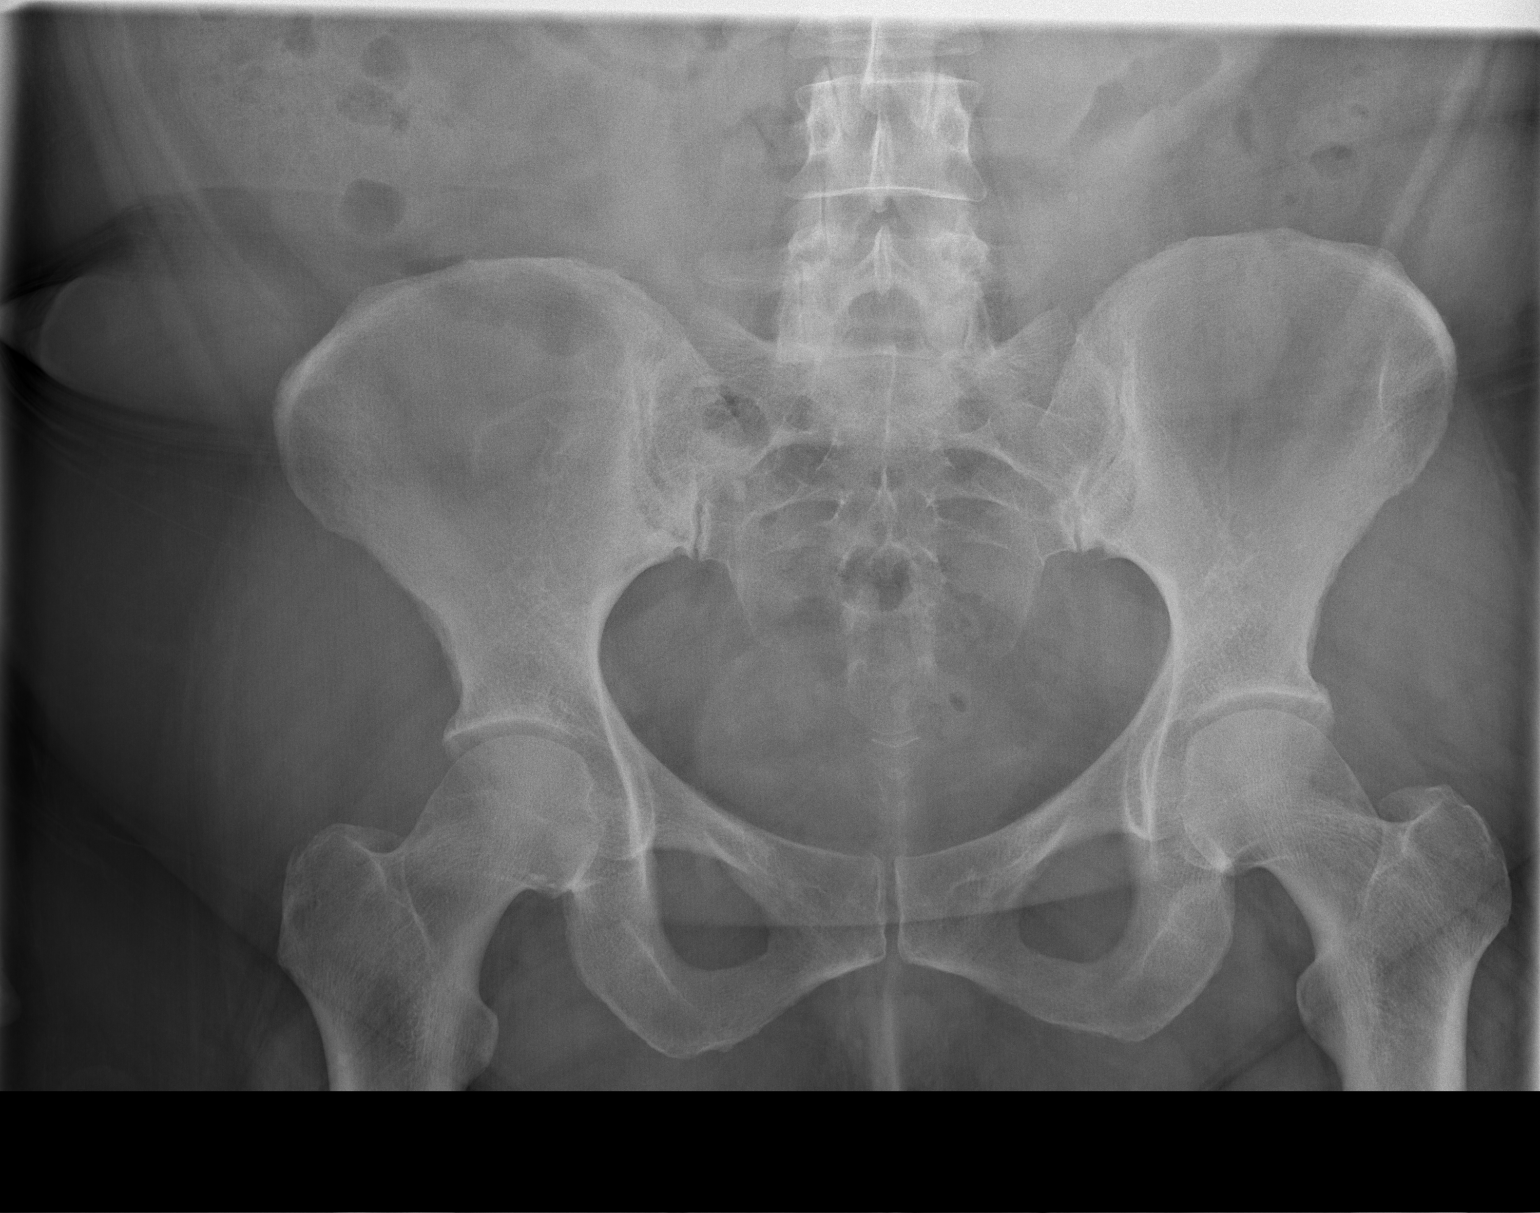

[2 of 2 positions shown; findings below may reference images not displayed]

FINDINGS: Bowel gas pattern is nonobstructive with a few air-filled nondilated
small bowel loops in the right mid abdomen. No right renal stones
visualized. 5 mm calcific density projects over the right sacrum
which may represent patient's previously seen proximal ureteral
stone now over the mid to distal ureter. 5 mm stone over the upper
pole left kidney. Remainder the exam is unchanged.
IMPRESSION: 1.  Nonobstructive bowel gas pattern.

2. 5 mm calcific density projects over the mid to distal right
ureter likely patient's known right ureteral stone. 5 mm upper pole
left renal stone.

## 2021-06-01 IMAGING — CR DG ABDOMEN 1V
2 series · 2 of 2 positions shown · non-contrast
Comparison: 09/01/2019

CLINICAL DATA: Pre lithotripsy, right-sided pain.

EXAM:
ABDOMEN - 1 VIEW

[abdomen kub (1 of 2)]
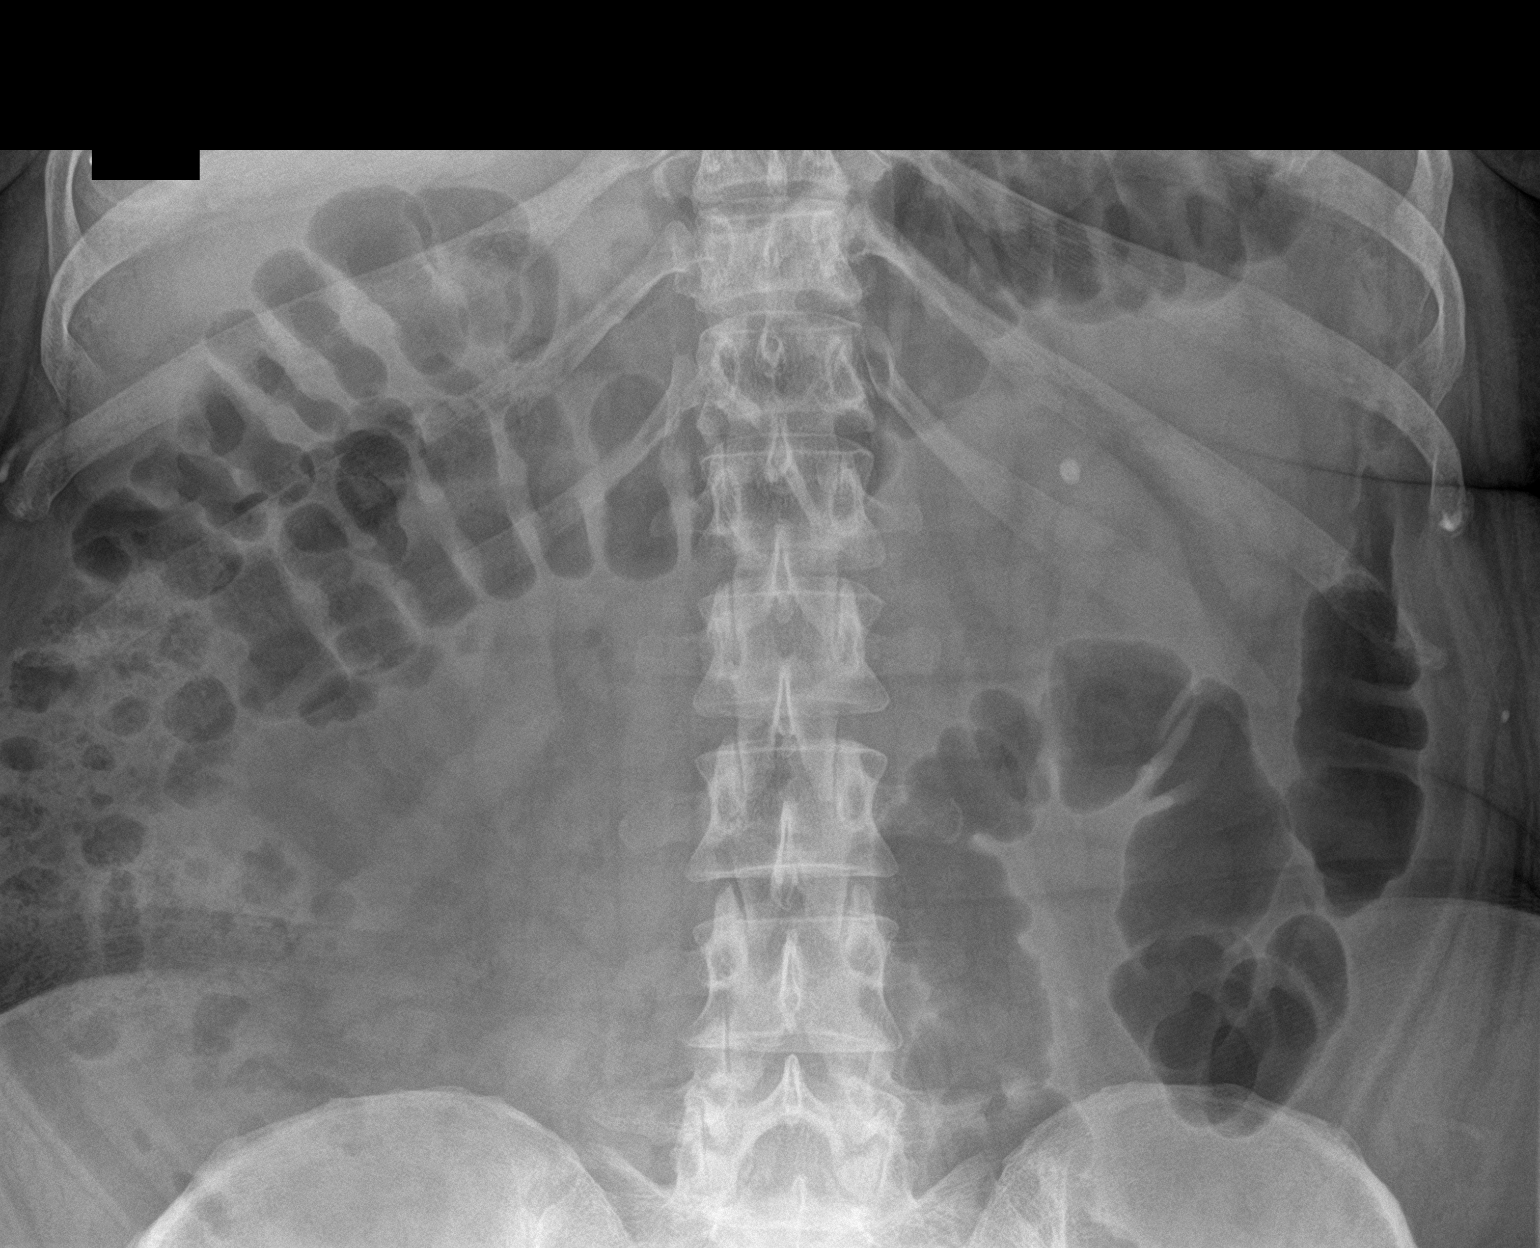

[abdomen kub (2 of 2)]
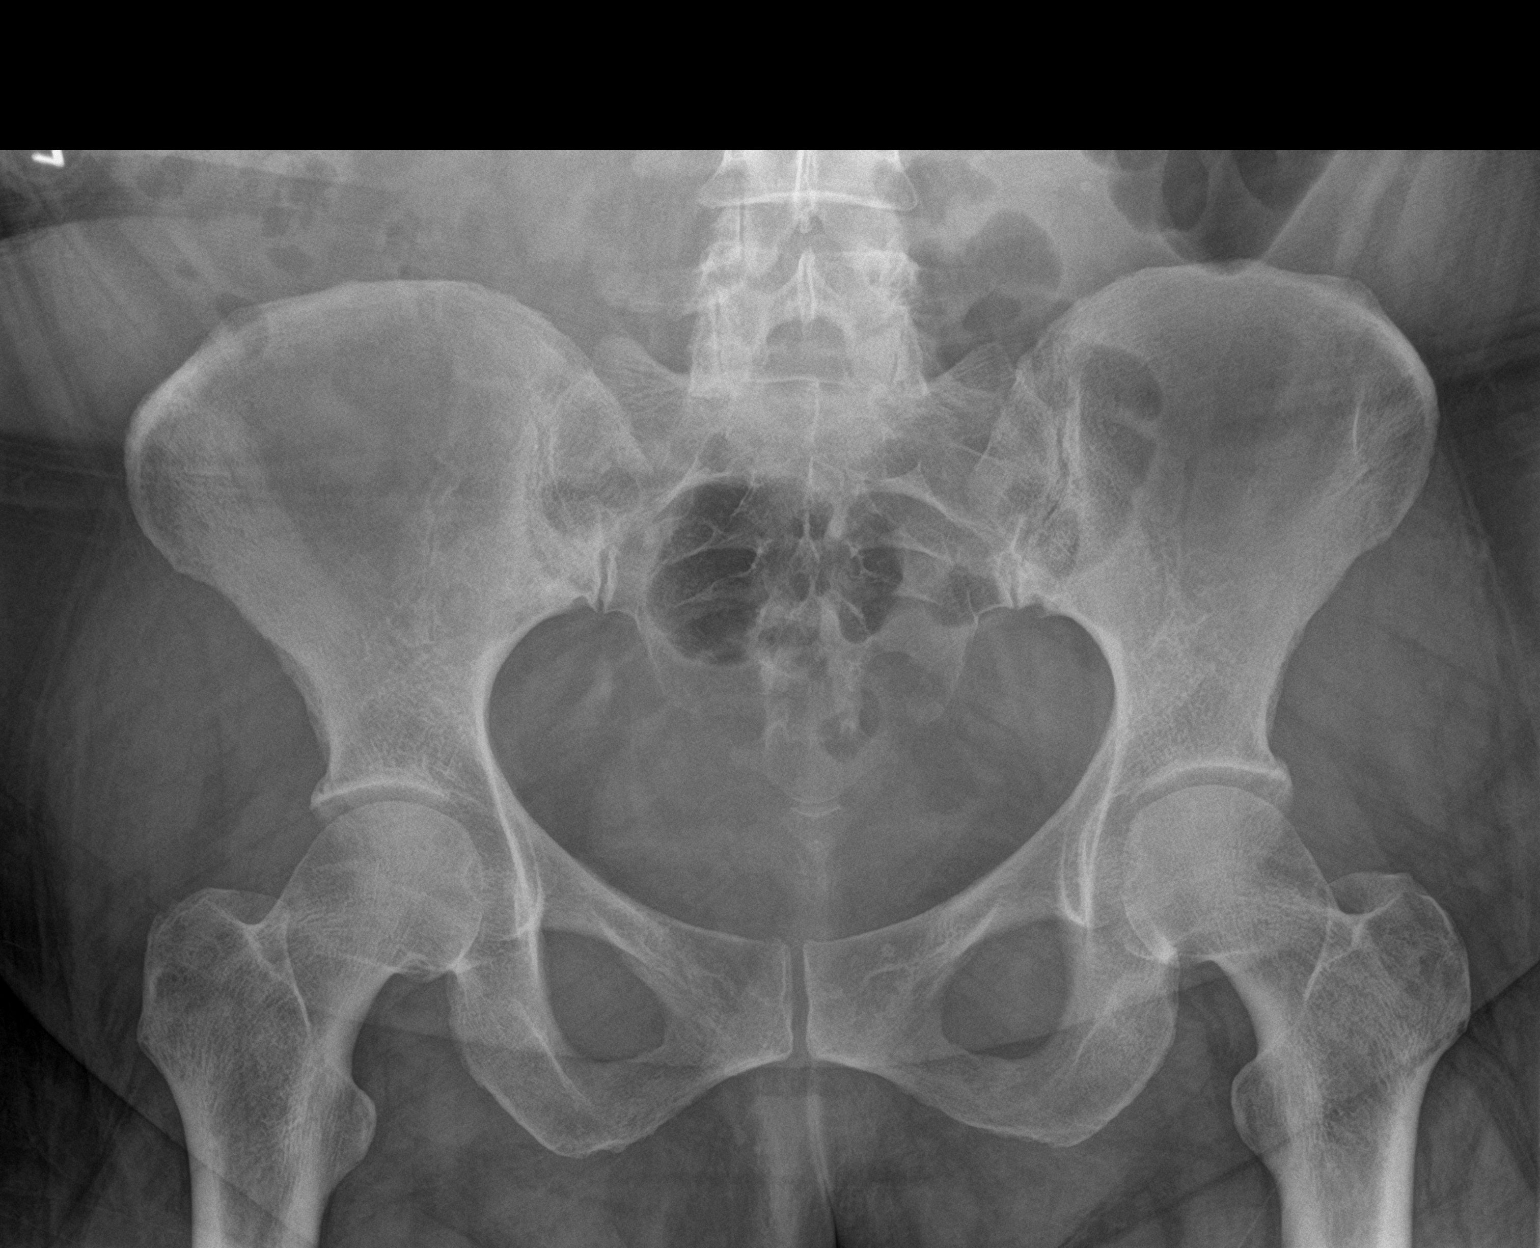

[2 of 2 positions shown; findings below may reference images not displayed]

FINDINGS: Six mm calculus overlies the left renal contour. Stool and gas
overlies the right renal contour.

Subtle calcific density over the right hemi sacrum, approximately
5-6 mm, potentially representing the previously identified calculus
along the right ureter.

Bowel gas pattern is unremarkable.

No acute bone process.
IMPRESSION: 1. Six mm calculus overlies the left renal contour.
2. Similar appearance to subtle added density adjacent to the S2
level, potentially representing previously identified renal
calculus.

## 2021-11-01 DIAGNOSIS — D689 Coagulation defect, unspecified: Secondary | ICD-10-CM | POA: Diagnosis not present

## 2021-11-01 DIAGNOSIS — I824Y9 Acute embolism and thrombosis of unspecified deep veins of unspecified proximal lower extremity: Secondary | ICD-10-CM | POA: Diagnosis not present

## 2021-11-01 DIAGNOSIS — I824Z1 Acute embolism and thrombosis of unspecified deep veins of right distal lower extremity: Secondary | ICD-10-CM | POA: Diagnosis not present

## 2021-11-01 DIAGNOSIS — I2699 Other pulmonary embolism without acute cor pulmonale: Secondary | ICD-10-CM | POA: Diagnosis not present

## 2021-11-01 DIAGNOSIS — Z7901 Long term (current) use of anticoagulants: Secondary | ICD-10-CM | POA: Diagnosis not present

## 2021-11-01 DIAGNOSIS — Z5181 Encounter for therapeutic drug level monitoring: Secondary | ICD-10-CM | POA: Diagnosis not present

## 2021-11-23 DIAGNOSIS — Z86718 Personal history of other venous thrombosis and embolism: Secondary | ICD-10-CM | POA: Diagnosis not present

## 2021-11-23 DIAGNOSIS — Z7901 Long term (current) use of anticoagulants: Secondary | ICD-10-CM | POA: Diagnosis not present

## 2021-11-23 DIAGNOSIS — Z86711 Personal history of pulmonary embolism: Secondary | ICD-10-CM | POA: Diagnosis not present

## 2021-11-23 DIAGNOSIS — Z5181 Encounter for therapeutic drug level monitoring: Secondary | ICD-10-CM | POA: Diagnosis not present

## 2021-12-01 DIAGNOSIS — N200 Calculus of kidney: Secondary | ICD-10-CM | POA: Diagnosis not present

## 2021-12-01 DIAGNOSIS — I491 Atrial premature depolarization: Secondary | ICD-10-CM | POA: Diagnosis not present

## 2021-12-01 DIAGNOSIS — Z86711 Personal history of pulmonary embolism: Secondary | ICD-10-CM | POA: Diagnosis not present

## 2021-12-01 DIAGNOSIS — D689 Coagulation defect, unspecified: Secondary | ICD-10-CM | POA: Diagnosis not present

## 2021-12-01 DIAGNOSIS — I517 Cardiomegaly: Secondary | ICD-10-CM | POA: Diagnosis not present

## 2021-12-01 DIAGNOSIS — G500 Trigeminal neuralgia: Secondary | ICD-10-CM | POA: Diagnosis not present

## 2021-12-01 DIAGNOSIS — R079 Chest pain, unspecified: Secondary | ICD-10-CM | POA: Diagnosis not present

## 2021-12-12 DIAGNOSIS — I2699 Other pulmonary embolism without acute cor pulmonale: Secondary | ICD-10-CM | POA: Diagnosis not present

## 2021-12-12 DIAGNOSIS — I824Z1 Acute embolism and thrombosis of unspecified deep veins of right distal lower extremity: Secondary | ICD-10-CM | POA: Diagnosis not present

## 2021-12-12 DIAGNOSIS — D689 Coagulation defect, unspecified: Secondary | ICD-10-CM | POA: Diagnosis not present

## 2021-12-12 DIAGNOSIS — Z7901 Long term (current) use of anticoagulants: Secondary | ICD-10-CM | POA: Diagnosis not present

## 2021-12-12 DIAGNOSIS — Z5181 Encounter for therapeutic drug level monitoring: Secondary | ICD-10-CM | POA: Diagnosis not present

## 2021-12-12 DIAGNOSIS — I824Y9 Acute embolism and thrombosis of unspecified deep veins of unspecified proximal lower extremity: Secondary | ICD-10-CM | POA: Diagnosis not present

## 2021-12-14 DIAGNOSIS — D689 Coagulation defect, unspecified: Secondary | ICD-10-CM | POA: Diagnosis not present

## 2021-12-14 DIAGNOSIS — I824Z1 Acute embolism and thrombosis of unspecified deep veins of right distal lower extremity: Secondary | ICD-10-CM | POA: Diagnosis not present

## 2021-12-14 DIAGNOSIS — I2699 Other pulmonary embolism without acute cor pulmonale: Secondary | ICD-10-CM | POA: Diagnosis not present

## 2021-12-14 DIAGNOSIS — Z5181 Encounter for therapeutic drug level monitoring: Secondary | ICD-10-CM | POA: Diagnosis not present

## 2021-12-14 DIAGNOSIS — I824Y9 Acute embolism and thrombosis of unspecified deep veins of unspecified proximal lower extremity: Secondary | ICD-10-CM | POA: Diagnosis not present

## 2021-12-14 DIAGNOSIS — Z7901 Long term (current) use of anticoagulants: Secondary | ICD-10-CM | POA: Diagnosis not present

## 2022-01-30 DIAGNOSIS — Z79899 Other long term (current) drug therapy: Secondary | ICD-10-CM | POA: Diagnosis not present

## 2022-01-30 DIAGNOSIS — G500 Trigeminal neuralgia: Secondary | ICD-10-CM | POA: Diagnosis not present

## 2022-02-28 DIAGNOSIS — G500 Trigeminal neuralgia: Secondary | ICD-10-CM | POA: Diagnosis not present

## 2022-02-28 DIAGNOSIS — R930 Abnormal findings on diagnostic imaging of skull and head, not elsewhere classified: Secondary | ICD-10-CM | POA: Diagnosis not present

## 2022-02-28 DIAGNOSIS — G932 Benign intracranial hypertension: Secondary | ICD-10-CM | POA: Diagnosis not present

## 2022-02-28 DIAGNOSIS — Z9889 Other specified postprocedural states: Secondary | ICD-10-CM | POA: Diagnosis not present

## 2022-03-17 DIAGNOSIS — G500 Trigeminal neuralgia: Secondary | ICD-10-CM | POA: Diagnosis not present

## 2022-03-17 DIAGNOSIS — Z79899 Other long term (current) drug therapy: Secondary | ICD-10-CM | POA: Diagnosis not present

## 2022-04-12 DIAGNOSIS — G500 Trigeminal neuralgia: Secondary | ICD-10-CM | POA: Diagnosis not present

## 2022-06-13 DIAGNOSIS — G500 Trigeminal neuralgia: Secondary | ICD-10-CM | POA: Diagnosis not present

## 2022-10-20 DIAGNOSIS — G500 Trigeminal neuralgia: Secondary | ICD-10-CM | POA: Diagnosis not present

## 2022-10-20 DIAGNOSIS — Z923 Personal history of irradiation: Secondary | ICD-10-CM | POA: Diagnosis not present

## 2022-10-20 DIAGNOSIS — Z9889 Other specified postprocedural states: Secondary | ICD-10-CM | POA: Diagnosis not present

## 2024-08-06 NOTE — Progress Notes (Unsigned)
 [facility] [address] [place],  [place]  [address] [phone]  Clinic Day:  08/07/2024  Referring physician: Allana Prentice RAMAN., DO   HISTORY OF PRESENT ILLNESS:  The patient is a 48 y.o. female who I was asked to consult upon for with respect to providing perioperative anticoagulation management.  Of note, the patient is scheduled for an upcoming laparoscopic cholecystectomy.  The patient initially had extensive bilateral pulmonary emboli, in conjunction with a DVT in her right lower extremity, back in 2008 while living in [place] .  The patient recalls waking up one morning short of breath.  Over the span of a week, shortness of breath and dyspnea upon exertion became extremely prominent.  While being evaluated at an urgent care facility, she was found to be hypoxic, which led to her being sent to a local hospital, which diagnosed her with extensive bilateral pulmonary emboli via a CT angiogram.  She was initially placed on a heparin drip before being switched to Coumadin upon discharge.  The patient also recalls having more pulmonary emboli in 2011.  However, she admits to not being compliant for numerous weeks with her Coumadin.  Since then, the patient has been on Xarelto, with which she has been compliant for these past years.  She denies having any further issues with blood clots since 2011.  Of note, the patient claims to have had an extensive hypercoagulable workup back in 2008, which came back negative.  She also claims there is no family history of blood clots.  She denies having any provoking factors that triggered her blood clots back in either 2008 or 2011, such as trauma, surgery, or prolonged travel.  PAST MEDICAL HISTORY:   Past Medical History:  Diagnosis Date   Diabetes mellitus without complication (HCC)    Thyroid disorder     PAST SURGICAL HISTORY:   Past Surgical History:  Procedure Laterality Date   BRAIN SURGERY     EXTRACORPOREAL  SHOCK WAVE LITHOTRIPSY Right 09/04/2019   Procedure: EXTRACORPOREAL SHOCK WAVE LITHOTRIPSY (ESWL);  Surgeon: Francisca Redell BROCKS, MD;  Location: [facility] ORS;  Service: Urology;  Laterality: Right;    CURRENT MEDICATIONS:   Current Outpatient Medications  Medication Sig Dispense Refill   atorvastatin (LIPITOR) 20 MG tablet Take 20 mg by mouth at bedtime.     hydrochlorothiazide (HYDRODIURIL) 12.5 MG tablet Take 12.5 mg by mouth daily.     levothyroxine (SYNTHROID) 125 MCG tablet Take 125 mcg by mouth daily.     metFORMIN (GLUCOPHAGE) 1000 MG tablet Take 1,000 mg by mouth 2 (two) times daily.     rivaroxaban (XARELTO) 10 MG TABS tablet Take 10 mg by mouth daily.     tamsulosin  (FLOMAX ) 0.4 MG CAPS capsule Take 1 capsule (0.4 mg total) by mouth daily. 14 capsule 0   No current facility-administered medications for this visit.    ALLERGIES:  No Known Allergies  FAMILY HISTORY:   Family History  Problem Relation Age of Onset   Seizures Mother    Diabetes Mother    Other Father        UNK HISTORY    SOCIAL HISTORY:  The patient was born and raised in [place], [place] .  She currently lives in town with her husband of 12 years.  She has 1 child.  She has been involved in childcare for 17 years.  There is no history of alcoholism or tobacco abuse.  REVIEW OF SYSTEMS:  Review of Systems  Constitutional:  Negative for fatigue and fever.  HENT:   Negative for hearing loss and sore throat.   Eyes:  Negative for eye problems.  Respiratory:  Negative for chest tightness, cough and hemoptysis.   Cardiovascular:  Negative for chest pain and palpitations.  Gastrointestinal:  Positive for diarrhea. Negative for abdominal distention, abdominal pain, blood in stool, constipation, nausea and vomiting.  Endocrine: Negative for hot flashes.  Genitourinary:  Positive for bladder incontinence and frequency. Negative for difficulty urinating, dysuria, hematuria and nocturia.   Musculoskeletal:  Negative  for arthralgias, back pain, gait problem and myalgias.  Skin: Negative.  Negative for itching and rash.  Neurological:  Positive for headaches. Negative for dizziness, extremity weakness, gait problem, light-headedness and numbness.  Hematological: Negative.   Psychiatric/Behavioral:  Negative for depression and suicidal ideas. The patient is nervous/anxious.      PHYSICAL EXAM:  Blood pressure (!) 145/90, pulse 74, temperature 98.2 F (36.8 C), temperature source Oral, resp. rate 14, height 5' 4 (1.626 m), weight 288 lb 8 oz (130.9 kg), SpO2 96%. Wt Readings from Last 3 Encounters:  08/07/24 288 lb 8 oz (130.9 kg)  09/30/19 250 lb (113.4 kg)  09/01/19 254 lb (115.2 kg)   Body mass index is 49.52 kg/m. Performance status (ECOG): 0 - Asymptomatic Physical Exam Constitutional:      Appearance: Normal appearance. She is not ill-appearing.  HENT:     Mouth/Throat:     Mouth: Mucous membranes are moist.     Pharynx: Oropharynx is clear. No oropharyngeal exudate or posterior oropharyngeal erythema.  Cardiovascular:     Rate and Rhythm: Normal rate and regular rhythm.     Heart sounds: No murmur heard.    No friction rub. No gallop.  Pulmonary:     Effort: Pulmonary effort is normal. No respiratory distress.     Breath sounds: Normal breath sounds. No wheezing, rhonchi or rales.  Abdominal:     General: Bowel sounds are normal. There is no distension.     Palpations: Abdomen is soft. There is no mass.     Tenderness: There is no abdominal tenderness.  Musculoskeletal:        General: No swelling.     Right lower leg: No edema.     Left lower leg: No edema.  Lymphadenopathy:     Cervical: No cervical adenopathy.     Upper Body:     Right upper body: No supraclavicular or axillary adenopathy.     Left upper body: No supraclavicular or axillary adenopathy.     Lower Body: No right inguinal adenopathy. No left inguinal adenopathy.  Skin:    General: Skin is warm.     Coloration:  Skin is not jaundiced.     Findings: No lesion or rash.  Neurological:     General: No focal deficit present.     Mental Status: She is alert and oriented to person, place, and time. Mental status is at baseline.  Psychiatric:        Mood and Affect: Mood normal.        Behavior: Behavior normal.        Thought Content: Thought content normal.    LABS:      Latest Ref Rng & Units 08/20/2019    9:01 PM  CBC  WBC 4.0 - 10.5 K/uL 7.6   Hemoglobin 12.0 - 15.0 g/dL 86.1   Hematocrit 63.9 - 46.0 % 42.6   Platelets 150 - 400 K/uL 267  Latest Ref Rng & Units 08/20/2019    9:01 PM  CMP  Glucose 70 - 99 mg/dL 92   BUN 6 - 20 mg/dL 23   Creatinine 9.55 - 1.00 mg/dL 9.13   Sodium 864 - 854 mmol/L 138   Potassium 3.5 - 5.1 mmol/L 4.0   Chloride 98 - 111 mmol/L 106   CO2 22 - 32 mmol/L 22   Calcium 8.9 - 10.3 mg/dL 9.1   Total Protein 6.5 - 8.1 g/dL 7.7   Total Bilirubin 0.3 - 1.2 mg/dL 0.7   Alkaline Phos 38 - 126 U/L 44   AST 15 - 41 U/L 19   ALT 0 - 44 U/L 21    ASSESSMENT & PLAN:  A 48 y.o. female who I was asked to consult upon for perioperative anticoagulation management.  My recommendation is for this patient to hold her Xarelto 2 days before her laparoscopic cholecystectomy is scheduled.  If there are no postoperative bleeding complications, she can get back to taking her Xarelto 24 hours later.  There is really no role for perioperative heparin bridging.  The patient understands these plans, which are essentially same as they have been with previous surgeries.  Of note, she experienced no bleeding or clotting complications with her prior hysterectomy and trigeminal neuralgia surgeries,  which were in 2015 and 2016, respectively.  As she has had 2 different episodes of pulmonary emboli, the patient knows she needs to be on lifelong anticoagulation.  Otherwise, as the patient is doing well and has no other pressing hematologic issues, I do feel comfortable turning her care back  over to her other physicians.  The patient understands all the plans discussed today and is in agreement with them.  I do appreciate Allana Prentice RAMAN., DO for his new consult.   Kelseigh Diver DELENA Kerns, MD

## 2024-08-07 ENCOUNTER — Encounter: Payer: Self-pay | Admitting: Oncology

## 2024-08-07 ENCOUNTER — Inpatient Hospital Stay: Attending: Oncology | Admitting: Oncology

## 2024-08-07 VITALS — BP 145/90 | HR 74 | Temp 98.2°F | Resp 14 | Ht 64.0 in | Wt 288.5 lb

## 2024-08-07 DIAGNOSIS — E079 Disorder of thyroid, unspecified: Secondary | ICD-10-CM

## 2024-08-07 DIAGNOSIS — Z86711 Personal history of pulmonary embolism: Secondary | ICD-10-CM | POA: Diagnosis not present

## 2024-08-07 DIAGNOSIS — Z7901 Long term (current) use of anticoagulants: Secondary | ICD-10-CM

## 2024-08-07 DIAGNOSIS — I2699 Other pulmonary embolism without acute cor pulmonale: Secondary | ICD-10-CM | POA: Insufficient documentation

## 2024-08-07 DIAGNOSIS — Z79899 Other long term (current) drug therapy: Secondary | ICD-10-CM

## 2024-08-07 DIAGNOSIS — I2692 Saddle embolus of pulmonary artery without acute cor pulmonale: Secondary | ICD-10-CM

## 2024-08-07 DIAGNOSIS — E119 Type 2 diabetes mellitus without complications: Secondary | ICD-10-CM
